# Patient Record
Sex: Male | Born: 2005 | Race: White | Hispanic: No | Marital: Single | Smoking: Never smoker
Health system: Southern US, Community
[De-identification: ages and names within clinical notes are randomized; demographics above are authoritative.]

## PROBLEM LIST (undated history)

## (undated) DIAGNOSIS — F909 Attention-deficit hyperactivity disorder, unspecified type: Secondary | ICD-10-CM

## (undated) DIAGNOSIS — F419 Anxiety disorder, unspecified: Secondary | ICD-10-CM

## (undated) HISTORY — DX: Anxiety disorder, unspecified: F41.9

## (undated) HISTORY — DX: Attention-deficit hyperactivity disorder, unspecified type: F90.9

---
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
## 2024-03-29 ENCOUNTER — Ambulatory Visit: Admitting: Student in an Organized Health Care Education/Training Program

## 2024-03-29 ENCOUNTER — Encounter: Payer: Self-pay | Admitting: Student in an Organized Health Care Education/Training Program

## 2024-03-29 VITALS — BP 130/64 | HR 99 | Ht 65.2 in | Wt 275.0 lb

## 2024-03-29 DIAGNOSIS — G479 Sleep disorder, unspecified: Secondary | ICD-10-CM

## 2024-03-29 DIAGNOSIS — R41840 Attention and concentration deficit: Secondary | ICD-10-CM | POA: Diagnosis not present

## 2024-03-29 DIAGNOSIS — E66813 Obesity, class 3: Secondary | ICD-10-CM | POA: Diagnosis not present

## 2024-03-29 DIAGNOSIS — F411 Generalized anxiety disorder: Secondary | ICD-10-CM | POA: Diagnosis not present

## 2024-03-29 DIAGNOSIS — Z6841 Body Mass Index (BMI) 40.0 and over, adult: Secondary | ICD-10-CM

## 2024-03-29 DIAGNOSIS — F988 Other specified behavioral and emotional disorders with onset usually occurring in childhood and adolescence: Secondary | ICD-10-CM | POA: Insufficient documentation

## 2024-03-29 LAB — POCT URINE DRUG SCREEN
Methylenedioxyamphetamine: NOT DETECTED
POC Amphetamine UR: NOT DETECTED
POC BENZODIAZEPINES UR: NOT DETECTED
POC Barbiturate UR: NOT DETECTED
POC Cocaine UR: NOT DETECTED
POC Ecstasy UR: NOT DETECTED
POC Marijuana UR: NOT DETECTED
POC Methadone UR: NOT DETECTED
POC Methamphetamine UR: NOT DETECTED
POC Opiate Ur: NOT DETECTED
POC Oxycodone UR: NOT DETECTED
POC PHENCYCLIDINE UR: NOT DETECTED
POC TRICYCLICS UR: NOT DETECTED
URINE TEMPERATURE: 98 [degF] (ref 90.0–100.0)

## 2024-03-29 MED ORDER — METHYLPHENIDATE HCL ER (XR) 30 MG PO CP24: 1.0000 | ORAL_CAPSULE | Freq: Every day | ORAL | 0 refills | Status: DC

## 2024-03-29 MED ORDER — ESCITALOPRAM OXALATE 20 MG PO TABS: 20.0000 mg | ORAL_TABLET | Freq: Every day | ORAL | 3 refills | Status: AC

## 2024-03-29 NOTE — Patient Instructions (Signed)
  VISIT SUMMARY: Today, we discussed your challenges with ADHD, anxiety, and sleep issues. You have been off your medications for three months, which has made it difficult to manage your symptoms. We have decided to restart your previous medications and address your sleep difficulties.  YOUR PLAN: -ATTENTION-DEFICIT HYPERACTIVITY DISORDER (ADHD): ADHD is a condition that affects your ability to focus and control impulses. We will restart your methylphenidate at a lower dose and adjust it based on how you respond. If you have trouble getting the medication at [facility], try [facility]. We will also need to check drug screens because this medication is controlled.  -GENERALIZED ANXIETY DISORDER: Generalized anxiety disorder involves persistent and excessive worry. We will restart your escitalopram at 20 mg, starting with half a tablet for four days and then moving to a full tablet to help minimize side effects.  -INSOMNIA, INITIAL (DIFFICULTY FALLING ASLEEP): Insomnia is difficulty falling or staying asleep. Your sleep issues may be worsened by anxiety and irregular sleep habits. To help, avoid taking naps during the day and start taking melatonin to help you fall asleep.  INSTRUCTIONS: Please monitor how you respond to the medications and make note of any side effects. We will have a follow-up appointment in one month to see how you are doing and make any necessary adjustments.

## 2024-03-29 NOTE — Assessment & Plan Note (Signed)
 Chronic issue.  Weight today 275 pounds and BMI 45.  Unable to get into this very much today, visit focused on anxiety and attention problems.  Would like to talk more about lifestyle modifications in the future.  Need to work on nutrition, and screen for weight related complications.  Will get records from his prior pediatrician for last blood work.

## 2024-03-29 NOTE — Assessment & Plan Note (Signed)
 Chronic problem, but recently worsened with poor control of anxiety.  We are restarting Lexapro and I am hopeful that will improve his sleep some.  I think lifestyle habits are the largest hindrance of his sleep.  He has a higher than recommended exposure to screen time, takes naps in the afternoon, no exercise.  I recommended working on this, especially reducing naps.  Recommended over-the-counter melatonin to help with sleep initiation in the evening, but warned this will only work if he has adequate sleep pressure from the day.

## 2024-03-29 NOTE — Progress Notes (Signed)
 New Patient Office Visit  Patient ID: Joseph Watson, Male   DOB: Nov 22, 2005 17 y.o. MRN: [id number]  Chief Complaint  Patient presents with   Establish Care    ADHD and Anxiety medications  Insomnia concerns    Subjective:     Joseph Watson presents to establish care  HPI  Discussed the use of AI scribe software for clinical note transcription with the patient, who gave verbal consent to proceed.  History of Present Illness Joseph Watson is an 18 year old male with ADHD and anxiety who presents for medication management and sleep issues.  He has been without his ADHD and anxiety medications for three months due to difficulty finding a new doctor. Being off his medications has been challenging, particularly in managing attention and emotional regulation. He was previously on 50 mg of methylphenidate extended-release once daily for ADHD, which he found effective. He also used a short-acting form for homework but no longer takes it. For anxiety, he was on escitalopram, possibly 20 mg, which he found effective, though he cannot recall the exact dose.  He reports long-standing anxiety, present for as long as he can remember, and has seen multiple psychiatrists in the past. He is unsure if he received a formal diagnosis for anxiety but believes he did. No history of bipolar disorder, schizophrenia, or psychosis. He has experienced increased irritability and difficulty enjoying activities when off his medications, impacting his ability to perform daily tasks such as cleaning, which is necessary as he is the primary person in the household able to do so.  He is experiencing significant sleep difficulties, specifically with initiating sleep, though he does not have trouble staying asleep. He typically goes to bed around 10 to 11 PM but has been taking long naps during the day, sometimes up to six hours, which he acknowledges is not part of his normal routine. He has tried  over-the-counter sleep aids in the past without success. He wakes up around 8 to 9 AM and has noticed his sleep schedule worsening over time.  He lives in [place] in a stable home environment and helps his parents with household tasks due to their health issues. He does not currently attend school due to difficulties and spends much of his time playing video games and interacting with online friends, spending about six to eight hours a day on these activities. No use of drugs, alcohol, or tobacco and no current thoughts of self-harm, though he has had such thoughts in the past without acting on them.   Outpatient Encounter Medications as of 03/29/2024  Medication Sig   escitalopram (LEXAPRO) 20 MG tablet Take 1 tablet (20 mg total) by mouth daily.   Methylphenidate HCl ER, XR, 30 MG CP24 Take 1 tablet by mouth daily.   [DISCONTINUED] cetirizine HCl (ZYRTEC) 5 MG/5ML SYRP Take 5 mg by mouth daily. (Patient not taking: Reported on 03/29/2024)   [DISCONTINUED] Vitamin D , Ergocalciferol , (DRISDOL ) 50000 UNITS CAPS capsule Take 1 capsule weekly (Patient not taking: Reported on 03/29/2024)   No facility-administered encounter medications on file as of 03/29/2024.    Past Medical History:  Diagnosis Date   ADHD    Anxiety     History reviewed. No pertinent surgical history.  Family History  Problem Relation Age of Onset   Arthritis Mother    Asthma Mother    Asthma Father    Heart disease Maternal Grandmother    Hypertension Maternal Grandmother    COPD Maternal Grandmother    Heart disease Maternal  Grandfather    Hypertension Maternal Grandfather        Objective:    BP 130/64   Pulse 99   Ht 5' 5.2 (1.656 m)   Wt 275 lb (124.7 kg)   SpO2 100%   BMI 45.48 kg/m   Physical Exam  Gen: Well-appearing young man Eyes: Normal Mouth: Normal oropharynx Neck: Very large beard, normal thyroid, no nodules or adenopathy Heart: Regular, no murmur Lungs: Unlabored, clear  throughout Abd: Soft, nontender Ext: Warm, no edema Neuro: Alert, conversational, full strength upper and lower extremities, normal gait and balance Psych: Withdrawn appearing, difficulty making eye contact and maintaining it, anxious appearing, disheveled, not responding to internal stimuli, speech is organized, thoughts are linear, calm and follows instructions     Assessment & Plan:   Problem List Items Addressed This Visit       High   ADD (attention deficit disorder) - Primary (Chronic)   Chronic problem.  Diagnosed many years ago with his pediatrician and historically managed with good benefits with methylphenidate extended release 50 mg daily.  Has been out of his medication for about 1 month and has had detriment in his symptoms.  Not very functional.  Out of school now, unemployed, helps his parents with their medical problems.  He has future goals, and is hopeful that it is getting back on medications may improve his motivation and organization.  Will plan to restart methylphenidate at 30 mg daily, will use an extended release formulation.  Follow-up in 1 month.  Urine drug test was appropriate today.  I reviewed the controlled database which was appropriate.  Can uptitrate in the future if needed.      Relevant Medications   Methylphenidate HCl ER, XR, 30 MG CP24   Other Relevant Orders   POCT Urine Drug Screen (Completed)   Generalized anxiety disorder (Chronic)   Chronic and seemingly severe anxiety with pretty heavy symptom burden, greatly impacting his daily functional abilities.  No previous issues with an underlying psychosis or other high risk psychiatric diagnosis like bipolar disease.  No psychotic symptoms.  Low risk for self-harm based on his current feelings and history.  Did well on SSRI in the past, will restart Lexapro 20 mg once daily.  Follow-up in 1 month.  I really hope we can make progress on his anxiety to improve his functional status.      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  Weight today 275 pounds and BMI 45.  Unable to get into this very much today, visit focused on anxiety and attention problems.  Would like to talk more about lifestyle modifications in the future.  Need to work on nutrition, and screen for weight related complications.  Will get records from his prior pediatrician for last blood work.      Relevant Medications   Methylphenidate HCl ER, XR, 30 MG CP24     Medium    Sleep disturbance (Chronic)   Chronic problem, but recently worsened with poor control of anxiety.  We are restarting Lexapro and I am hopeful that will improve his sleep some.  I think lifestyle habits are the largest hindrance of his sleep.  He has a higher than recommended exposure to screen time, takes naps in the afternoon, no exercise.  I recommended working on this, especially reducing naps.  Recommended over-the-counter melatonin to help with sleep initiation in the evening, but  warned this will only work if he has adequate sleep pressure from the day.        Return in about 4 weeks (around 04/26/2024).   Cleatus Debby Specking, MD [facility]

## 2024-03-29 NOTE — Assessment & Plan Note (Signed)
 Chronic and seemingly severe anxiety with pretty heavy symptom burden, greatly impacting his daily functional abilities.  No previous issues with an underlying psychosis or other high risk psychiatric diagnosis like bipolar disease.  No psychotic symptoms.  Low risk for self-harm based on his current feelings and history.  Did well on SSRI in the past, will restart Lexapro 20 mg once daily.  Follow-up in 1 month.  I really hope we can make progress on his anxiety to improve his functional status.

## 2024-03-29 NOTE — Assessment & Plan Note (Signed)
 Chronic problem.  Diagnosed many years ago with his pediatrician and historically managed with good benefits with methylphenidate extended release 50 mg daily.  Has been out of his medication for about 1 month and has had detriment in his symptoms.  Not very functional.  Out of school now, unemployed, helps his parents with their medical problems.  He has future goals, and is hopeful that it is getting back on medications may improve his motivation and organization.  Will plan to restart methylphenidate at 30 mg daily, will use an extended release formulation.  Follow-up in 1 month.  Urine drug test was appropriate today.  I reviewed the controlled database which was appropriate.  Can uptitrate in the future if needed.

## 2024-04-10 ENCOUNTER — Other Ambulatory Visit: Payer: Self-pay

## 2024-04-10 DIAGNOSIS — R41840 Attention and concentration deficit: Secondary | ICD-10-CM

## 2024-04-10 NOTE — Telephone Encounter (Signed)
 Requested Prescriptions   Pending Prescriptions Disp Refills   Methylphenidate HCl ER, XR, 30 MG CP24 30 capsule 0    Sig: Take 1 tablet by mouth daily.     Date of patient request: 04/10/2024 Last office visit: 03/29/2024 Upcoming visit: 05/01/2024 Date of last refill: 03/29/2024 Last refill amount: 30

## 2024-04-10 NOTE — Telephone Encounter (Signed)
 I prescribed a methylphenidate on 10/30 for 30-day supply.  Looks from the database like it has not been dispensed yet.  Can someone check in with the patient and/or pharmacy about this please?

## 2024-04-11 NOTE — Telephone Encounter (Signed)
 Will call [facility] when they open

## 2024-04-12 MED ORDER — METHYLPHENIDATE HCL ER (LA) 30 MG PO CP24: 30.0000 mg | ORAL_CAPSULE | ORAL | 0 refills | Status: DC

## 2024-04-12 NOTE — Addendum Note (Signed)
 Addended by: JERRELL SOLIAN T on: 04/12/2024 02:47 PM   Modules accepted: Orders

## 2024-04-12 NOTE — Telephone Encounter (Signed)
 Called the pharmacy and they stated the original prescription that was ordered is on back order, but they do have methylphenidate LA 30mg  in stock and was wondering if we could change the prescription to this? Pharmacist states its almost the same prescription the only difference is the flavor.

## 2024-04-12 NOTE — Telephone Encounter (Signed)
 Not able to reach patient with either phone numbers on file to let him know of this change. Left Vm with the 2 phone numbers available to return call

## 2024-04-12 NOTE — Telephone Encounter (Signed)
 Thank you for the information.  I think a formulation change from the methylphenidate XR to methylphenidate LA is acceptable.  I have sent a new prescription.

## 2024-05-01 ENCOUNTER — Ambulatory Visit: Admitting: Student in an Organized Health Care Education/Training Program

## 2024-05-01 ENCOUNTER — Encounter: Payer: Self-pay | Admitting: Student in an Organized Health Care Education/Training Program

## 2024-05-01 VITALS — BP 147/57 | HR 107 | Wt 280.0 lb

## 2024-05-01 DIAGNOSIS — E66813 Obesity, class 3: Secondary | ICD-10-CM | POA: Diagnosis not present

## 2024-05-01 DIAGNOSIS — R41840 Attention and concentration deficit: Secondary | ICD-10-CM | POA: Diagnosis not present

## 2024-05-01 DIAGNOSIS — Z131 Encounter for screening for diabetes mellitus: Secondary | ICD-10-CM

## 2024-05-01 DIAGNOSIS — Z13220 Encounter for screening for lipoid disorders: Secondary | ICD-10-CM

## 2024-05-01 DIAGNOSIS — F411 Generalized anxiety disorder: Secondary | ICD-10-CM | POA: Diagnosis not present

## 2024-05-01 DIAGNOSIS — I10 Essential (primary) hypertension: Secondary | ICD-10-CM | POA: Insufficient documentation

## 2024-05-01 MED ORDER — METHYLPHENIDATE HCL ER (LA) 30 MG PO CP24: 30.0000 mg | ORAL_CAPSULE | ORAL | 0 refills | Status: DC

## 2024-05-01 NOTE — Assessment & Plan Note (Signed)
 Anxiety symptoms have improved with escitalopram , with no significant side effects. Consistent medication use is emphasized to avoid withdrawal. He expressed anxiety about blood work. Continue escitalopram  20 mg daily and encourage consistent daily use. Discussed potential anxiety management strategies for blood work.

## 2024-05-01 NOTE — Progress Notes (Signed)
 Established Patient Office Visit  Patient ID: Joseph Watson, male    DOB: 2005/08/31  Age: 18 y.o. MRN: [id number] PCP: Jerrell Cleatus Ned, MD  Chief Complaint  Patient presents with   Medication Management    4 week f/u     Subjective:     HPI  Discussed the use of AI scribe software for clinical note transcription with the patient, who gave verbal consent to proceed.  History of Present Illness Joseph Watson is a 18 year old male who presents for follow-up on methylphenidate  and Lexapro  use.  He has been experiencing attention issues and was restarted on methylphenidate  approximately three weeks ago after a six-month hiatus. The medication has significantly improved his focus and calmness, although he is adjusting to its effects on his concentration. He describes a need to readjust his 'locus of attention' as the medication sometimes causes hyperfocus. No significant side effects apart from appetite fluctuations, with varying levels of hunger from day to day.  He is also taking Lexapro , which he started at a half tablet and has now increased to a full tablet. He reports that Lexapro  has helped with his anxiety. He takes both medications consistently every morning, with only one or two missed doses. No side effects from Lexapro , aside from the appetite changes, which he attributes to the combination of medications.  He mentions a family history of OCD, as both his parents have the condition. Although he does not believe he has OCD, his mother suggested discussing it with his therapist. He also experiences fluctuating motivation levels.  Socially, he does not have a driver's license and plans to work on obtaining it after the holidays. He is comfortable staying alone to take care of the family dogs while his parents are away. He goes on walks daily and does not experience agoraphobia, but finds interacting with people challenging.     Objective:     BP (!) 147/57    Pulse (!) 107   Wt 280 lb (127 kg)   SpO2 100%   BMI 46.31 kg/m   Physical Exam  Gen: Disheveled appearing man Heart: Tachycardic, bounding heart beat, no murmur Lungs: Unlabored, clear Ext: Warm, no edema, no tremor Psych: Moderately anxious appearing, conversational    Assessment & Plan:   Problem List Items Addressed This Visit       High   ADD (attention deficit disorder) - Primary (Chronic)   ADHD symptoms have improved with methylphenidate , though he reports hyperfocus and appetite fluctuations. Slightly elevated blood pressure may be due to the medication. Continue methylphenidate  30 mg daily. Monitor blood pressure and appetite changes. Prescribed a refill of methylphenidate .  I reviewed the database which was appropriate.  Drug screen last month was appropriate.      Relevant Medications   methylphenidate  (RITALIN  LA) 30 MG 24 hr capsule   Generalized anxiety disorder (Chronic)   Anxiety symptoms have improved with escitalopram , with no significant side effects. Consistent medication use is emphasized to avoid withdrawal. He expressed anxiety about blood work. Continue escitalopram  20 mg daily and encourage consistent daily use. Discussed potential anxiety management strategies for blood work.      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 Ordered blood work to assess thyroid function, kidney function, electrolytes, cholesterol, and blood sugars. Scheduled a lab-only visit for blood work.  We talked about lifestyle modifications including nutrition changes.      Relevant  Medications   methylphenidate  (RITALIN  LA) 30 MG 24 hr capsule   Other Relevant Orders   Comprehensive metabolic panel with GFR   TSH     Medium    Hypertension (Chronic)   New issue of mildly elevated blood pressure consistent with stage II hypertension.  He is very anxious in our office visits, which I anticipate is  contributing to the hypertension.  Blood pressure also is a little worse after starting methylphenidate .  Would like to monitor this a little longer.  I recommended blood work to check his kidney function, he declines for today but will try to make an appointment for a lab only visit when he is less nervous.      Other Visit Diagnoses       Screening for lipid disorders       Relevant Orders   Lipid panel     Screening for diabetes mellitus       Relevant Orders   Hemoglobin A1c       Return in about 2 months (around 07/02/2024).    Cleatus Debby Specking, MD [facility]

## 2024-05-01 NOTE — Assessment & Plan Note (Addendum)
 ADHD symptoms have improved with methylphenidate , though he reports hyperfocus and appetite fluctuations. Slightly elevated blood pressure may be due to the medication. Continue methylphenidate  30 mg daily. Monitor blood pressure and appetite changes. Prescribed a refill of methylphenidate .  I reviewed the database which was appropriate.  Drug screen last month was appropriate.

## 2024-05-01 NOTE — Assessment & Plan Note (Signed)
 New issue of mildly elevated blood pressure consistent with stage II hypertension.  He is very anxious in our office visits, which I anticipate is contributing to the hypertension.  Blood pressure also is a little worse after starting methylphenidate .  Would like to monitor this a little longer.  I recommended blood work to check his kidney function, he declines for today but will try to make an appointment for a lab only visit when he is less nervous.

## 2024-05-01 NOTE — Patient Instructions (Signed)
  VISIT SUMMARY: Today, we discussed your progress with your current medications, methylphenidate  and Lexapro , and addressed some of your ongoing health concerns. You reported improvements in focus and anxiety, with some minor side effects. We also talked about your weight and sleep patterns, and planned some follow-up actions to monitor your health.  YOUR PLAN: -ATTENTION DEFICIT HYPERACTIVITY DISORDER (ADHD): ADHD is a condition that affects your ability to focus and control impulses. Your symptoms have improved with methylphenidate , though you mentioned experiencing hyperfocus and appetite fluctuations. Continue taking methylphenidate  30 mg daily. We will monitor your blood pressure and appetite changes. A refill for methylphenidate  has been prescribed.  -GENERALIZED ANXIETY DISORDER: Generalized Anxiety Disorder is characterized by excessive worry and anxiety. Your anxiety symptoms have improved with escitalopram  (Lexapro ). It is important to take your medication consistently every day to avoid withdrawal symptoms. We also discussed strategies to manage anxiety related to blood work. Continue taking escitalopram  20 mg daily.  -OBESITY, CLASS III: Obesity is a condition where excess body fat may affect your health. Due to your weight and elevated heart rate, we have ordered blood work to check your thyroid function, kidney function, electrolytes, cholesterol, and blood sugars. Please schedule a lab-only visit for this blood work.  -SLEEP DISTURBANCE: Sleep disturbance refers to problems with your sleep patterns. Your sleep schedule has improved, and we are considering melatonin to help with any remaining sleep issues.  INSTRUCTIONS: Please schedule a lab-only visit for your blood work. Continue taking your medications as prescribed and monitor any changes in your symptoms. Follow up with us  if you experience any new or worsening symptoms.

## 2024-05-01 NOTE — Assessment & Plan Note (Signed)
 Blood work is recommended due to weight issues and elevated heart rate. Ordered blood work to assess thyroid function, kidney function, electrolytes, cholesterol, and blood sugars. Scheduled a lab-only visit for blood work.  We talked about lifestyle modifications including nutrition changes.

## 2024-05-08 ENCOUNTER — Other Ambulatory Visit

## 2024-05-08 DIAGNOSIS — Z131 Encounter for screening for diabetes mellitus: Secondary | ICD-10-CM

## 2024-05-08 DIAGNOSIS — E66813 Obesity, class 3: Secondary | ICD-10-CM

## 2024-05-08 DIAGNOSIS — Z13220 Encounter for screening for lipoid disorders: Secondary | ICD-10-CM

## 2024-05-08 LAB — COMPREHENSIVE METABOLIC PANEL WITH GFR
ALT: 19 U/L (ref 0–53)
AST: 21 U/L (ref 0–37)
Albumin: 4.9 g/dL (ref 3.5–5.2)
Alkaline Phosphatase: 112 U/L (ref 52–171)
BUN: 10 mg/dL (ref 6–23)
CO2: 27 meq/L (ref 19–32)
Calcium: 9.5 mg/dL (ref 8.4–10.5)
Chloride: 103 meq/L (ref 96–112)
Creatinine, Ser: 0.79 mg/dL (ref 0.40–1.50)
GFR: 129.18 mL/min (ref >60.00)
Glucose, Bld: 107 mg/dL — ABNORMAL HIGH (ref 70–99)
Potassium: 4 meq/L (ref 3.5–5.1)
Sodium: 139 meq/L (ref 135–145)
Total Bilirubin: 0.4 mg/dL (ref 0.2–1.2)
Total Protein: 7.6 g/dL (ref 6.0–8.3)

## 2024-05-08 LAB — LIPID PANEL
Cholesterol: 224 mg/dL — ABNORMAL HIGH (ref 0–200)
HDL: 36.6 mg/dL — ABNORMAL LOW (ref >39.00)
LDL Cholesterol: 127 mg/dL — ABNORMAL HIGH (ref 0–99)
NonHDL: 187.02
Total CHOL/HDL Ratio: 6
Triglycerides: 300 mg/dL — ABNORMAL HIGH (ref 0.0–149.0)
VLDL: 60 mg/dL — ABNORMAL HIGH (ref 0.0–40.0)

## 2024-05-08 LAB — HEMOGLOBIN A1C: Hgb A1c MFr Bld: 5.6 % (ref 4.6–6.5)

## 2024-05-08 LAB — TSH: TSH: 1.92 u[IU]/mL (ref 0.40–5.00)

## 2024-05-09 ENCOUNTER — Ambulatory Visit: Payer: Self-pay | Admitting: Student in an Organized Health Care Education/Training Program

## 2024-06-19 ENCOUNTER — Telehealth: Payer: Self-pay

## 2024-06-19 DIAGNOSIS — R41840 Attention and concentration deficit: Secondary | ICD-10-CM

## 2024-06-19 NOTE — Telephone Encounter (Signed)
 Requested Prescriptions   Pending Prescriptions Disp Refills   methylphenidate  (RITALIN  LA) 30 MG 24 hr capsule 30 capsule 0    Sig: Take 1 capsule (30 mg total) by mouth every morning.     Date of patient request: 06/19/24 Last office visit: 05/01/24 Upcoming visit: 07/02/24 Date of last refill: 05/01/24 Last refill amount: 30      Copied from CRM #[id number]. Topic: Clinical - Medication Refill >> Jun 19, 2024  3:14 PM Alexandria E wrote: Medication: methylphenidate  (RITALIN  LA) 30 MG 24 hr capsule  Has the patient contacted their pharmacy? Yes (Agent: If no, request that the patient contact the pharmacy for the refill. If patient does not wish to contact the pharmacy document the reason why and proceed with request.) (Agent: If yes, when and what did the pharmacy advise?)  This is the patient's preferred pharmacy:  [id number] - [place], [place] - [address] AT [facility] [address] [place] [address] Phone: [phone] Fax: [phone]  Is this the correct pharmacy for this prescription? Yes If no, delete pharmacy and type the correct one.   Has the prescription been filled recently? No  Is the patient out of the medication? Yes  Has the patient been seen for an appointment in the last year OR does the patient have an upcoming appointment? Yes  Can we respond through MyChart? No  Agent: Please be advised that Rx refills may take up to 3 business days. We ask that you follow-up with your pharmacy.

## 2024-06-20 MED ORDER — METHYLPHENIDATE HCL ER (LA) 30 MG PO CP24: 30.0000 mg | ORAL_CAPSULE | ORAL | 0 refills | Status: AC

## 2024-07-02 ENCOUNTER — Ambulatory Visit: Admitting: Student in an Organized Health Care Education/Training Program
